# Patient Record
Sex: Female | Born: 1980 | Race: Black or African American | Hispanic: No | Marital: Married | Smoking: Never smoker
Health system: Southern US, Community
[De-identification: ages and names within clinical notes are randomized; demographics above are authoritative.]

---

## 2014-11-20 ENCOUNTER — Ambulatory Visit: Payer: Self-pay | Admitting: Family Medicine

## 2014-11-20 LAB — URINALYSIS, COMPLETE
BACTERIA: NEGATIVE
BILIRUBIN, UR: NEGATIVE
Blood: NEGATIVE
Glucose,UR: NEGATIVE
Ketone: NEGATIVE
LEUKOCYTE ESTERASE: NEGATIVE
Nitrite: NEGATIVE
PH: 5.5 (ref 5.0–8.0)
Protein: NEGATIVE
Specific Gravity: 1.025 (ref 1.000–1.030)
WBC UR: NONE SEEN /HPF (ref 0–5)

## 2014-11-20 LAB — PREGNANCY, URINE: PREGNANCY TEST, URINE: NEGATIVE m[IU]/mL

## 2014-11-20 LAB — WET PREP, GENITAL

## 2014-11-21 LAB — GC/CHLAMYDIA PROBE AMP

## 2014-11-26 ENCOUNTER — Ambulatory Visit: Payer: Self-pay | Admitting: Family Medicine

## 2015-11-02 ENCOUNTER — Emergency Department: Payer: Self-pay

## 2015-11-02 ENCOUNTER — Emergency Department
Admission: EM | Admit: 2015-11-02 | Discharge: 2015-11-02 | Disposition: A | Discharge status: Home / Self Care | Payer: Self-pay | Attending: Emergency Medicine | Admitting: Emergency Medicine

## 2015-11-02 ENCOUNTER — Encounter: Payer: Self-pay | Admitting: Emergency Medicine

## 2015-11-02 DIAGNOSIS — R51 Headache: Secondary | ICD-10-CM | POA: Insufficient documentation

## 2015-11-02 DIAGNOSIS — M549 Dorsalgia, unspecified: Secondary | ICD-10-CM | POA: Insufficient documentation

## 2015-11-02 DIAGNOSIS — R0600 Dyspnea, unspecified: Secondary | ICD-10-CM | POA: Insufficient documentation

## 2015-11-02 DIAGNOSIS — R42 Dizziness and giddiness: Secondary | ICD-10-CM | POA: Insufficient documentation

## 2015-11-02 DIAGNOSIS — R0602 Shortness of breath: Secondary | ICD-10-CM | POA: Insufficient documentation

## 2015-11-02 DIAGNOSIS — R110 Nausea: Secondary | ICD-10-CM | POA: Insufficient documentation

## 2015-11-02 DIAGNOSIS — Z3202 Encounter for pregnancy test, result negative: Secondary | ICD-10-CM | POA: Insufficient documentation

## 2015-11-02 DIAGNOSIS — R1013 Epigastric pain: Secondary | ICD-10-CM | POA: Insufficient documentation

## 2015-11-02 DIAGNOSIS — R079 Chest pain, unspecified: Secondary | ICD-10-CM | POA: Insufficient documentation

## 2015-11-02 DIAGNOSIS — R1011 Right upper quadrant pain: Secondary | ICD-10-CM | POA: Insufficient documentation

## 2015-11-02 LAB — URINALYSIS COMPLETE WITH MICROSCOPIC (ARMC ONLY)
BACTERIA UA: NONE SEEN
BILIRUBIN URINE: NEGATIVE
GLUCOSE, UA: NEGATIVE mg/dL
HGB URINE DIPSTICK: NEGATIVE
Leukocytes, UA: NEGATIVE
NITRITE: NEGATIVE
Protein, ur: NEGATIVE mg/dL
Specific Gravity, Urine: 1.013 (ref 1.005–1.030)
pH: 7 (ref 5.0–8.0)

## 2015-11-02 LAB — POCT PREGNANCY, URINE: Preg Test, Ur: NEGATIVE

## 2015-11-02 LAB — COMPREHENSIVE METABOLIC PANEL
ALBUMIN: 3.9 g/dL (ref 3.5–5.0)
ALK PHOS: 82 U/L (ref 38–126)
ALT: 36 U/L (ref 14–54)
AST: 54 U/L — AB (ref 15–41)
Anion gap: 6 (ref 5–15)
BILIRUBIN TOTAL: 1.2 mg/dL (ref 0.3–1.2)
BUN: 12 mg/dL (ref 6–20)
CALCIUM: 8.6 mg/dL — AB (ref 8.9–10.3)
CO2: 25 mmol/L (ref 22–32)
Chloride: 108 mmol/L (ref 101–111)
Creatinine, Ser: 0.74 mg/dL (ref 0.44–1.00)
GFR calc Af Amer: >60 mL/min (ref >60)
GFR calc non Af Amer: >60 mL/min (ref >60)
GLUCOSE: 112 mg/dL — AB (ref 65–99)
Potassium: 3 mmol/L — ABNORMAL LOW (ref 3.5–5.1)
SODIUM: 139 mmol/L (ref 135–145)
TOTAL PROTEIN: 7.2 g/dL (ref 6.5–8.1)

## 2015-11-02 LAB — CBC
HCT: 38.6 % (ref 35.0–47.0)
Hemoglobin: 12.9 g/dL (ref 12.0–16.0)
MCH: 29.9 pg (ref 26.0–34.0)
MCHC: 33.4 g/dL (ref 32.0–36.0)
MCV: 89.6 fL (ref 80.0–100.0)
PLATELETS: 335 10*3/uL (ref 150–440)
RBC: 4.31 MIL/uL (ref 3.80–5.20)
RDW: 13.1 % (ref 11.5–14.5)
WBC: 9.3 10*3/uL (ref 3.6–11.0)

## 2015-11-02 LAB — TROPONIN I: Troponin I: <0.03 ng/mL (ref <0.031)

## 2015-11-02 LAB — LIPASE, BLOOD: Lipase: 42 U/L (ref 11–51)

## 2015-11-02 MED ORDER — ONDANSETRON 4 MG PO TBDP: 4.0000 mg | ORAL_TABLET | Freq: Four times a day (QID) | ORAL | Status: DC | PRN

## 2015-11-02 MED ORDER — MORPHINE SULFATE (PF) 4 MG/ML IV SOLN
4.0000 mg | Freq: Once | INTRAVENOUS | Status: AC
  Administered 2015-11-02: 4 mg via INTRAVENOUS
  Filled 2015-11-02: qty 1

## 2015-11-02 MED ORDER — SODIUM CHLORIDE 0.9 % IV BOLUS (SEPSIS)
1000.0000 mL | Freq: Once | INTRAVENOUS | Status: AC
  Administered 2015-11-02: 1000 mL via INTRAVENOUS

## 2015-11-02 MED ORDER — IOHEXOL 300 MG/ML  SOLN
100.0000 mL | Freq: Once | INTRAMUSCULAR | Status: AC | PRN
  Administered 2015-11-02: 100 mL via INTRAVENOUS

## 2015-11-02 MED ORDER — POTASSIUM CHLORIDE CRYS ER 20 MEQ PO TBCR
40.0000 meq | EXTENDED_RELEASE_TABLET | Freq: Once | ORAL | Status: AC
  Administered 2015-11-02: 40 meq via ORAL
  Filled 2015-11-02: qty 2

## 2015-11-02 MED ORDER — PROMETHAZINE HCL 25 MG PO TABS: 25.0000 mg | ORAL_TABLET | Freq: Three times a day (TID) | ORAL | Status: AC | PRN

## 2015-11-02 MED ORDER — ONDANSETRON HCL 4 MG/2ML IJ SOLN
4.0000 mg | Freq: Once | INTRAMUSCULAR | Status: AC
Start: 2015-11-02 — End: 2015-11-02
  Administered 2015-11-02: 4 mg via INTRAVENOUS
  Filled 2015-11-02: qty 2

## 2015-11-02 MED ORDER — ONDANSETRON HCL 4 MG/2ML IJ SOLN
4.0000 mg | Freq: Once | INTRAMUSCULAR | Status: AC
  Administered 2015-11-02: 4 mg via INTRAVENOUS
  Filled 2015-11-02: qty 2

## 2015-11-02 MED ORDER — IOHEXOL 240 MG/ML SOLN
25.0000 mL | Freq: Once | INTRAMUSCULAR | Status: AC | PRN
  Administered 2015-11-02: 25 mL via ORAL

## 2015-11-02 NOTE — ED Notes (Signed)
Pt went to CT

## 2015-11-02 NOTE — ED Notes (Signed)
Patient transported to X-ray via stretcher 

## 2015-11-02 NOTE — ED Notes (Signed)
Patient with sudden onset of chest and upper back pain with shortness of breath that started about 20 minutes before arrival. Patient states that the chest and back pain is better but now has severe abd pain.

## 2015-11-02 NOTE — ED Notes (Signed)
Pt returned from CT °

## 2015-11-02 NOTE — ED Notes (Addendum)
Pt presents to ED c/o of abdominal pain following sudden onset of chest pain, back pain, SOB. Pt states that SOB and chest pain have subsided, states back pain is located middle of back. Pt states hx of abdominal pain with no explanation. Pt states she takes excedrin migraine daily and states "I don't know if that has anything to do with it or not.". Pt is laying on stretcher rigid and holding abdomen with right hand.

## 2015-11-02 NOTE — ED Notes (Signed)
Unsuccessful attempt to collect urine specimen. Informed pt to notify staff when pt is able to void again. Pt verbalized understanding.

## 2015-11-02 NOTE — Discharge Instructions (Signed)
Abdominal Pain, Adult Many things can cause abdominal pain. Usually, abdominal pain is not caused by a disease and will improve without treatment. It can often be observed and treated at home. Your health care provider will do a physical exam and possibly order blood tests and X-rays to help determine the seriousness of your pain. However, in many cases, more time must pass before a clear cause of the pain can be found. Before that point, your health care provider may not know if you need more testing or further treatment. HOME CARE INSTRUCTIONS Monitor your abdominal pain for any changes. The following actions may help to alleviate any discomfort you are experiencing:  Only take over-the-counter or prescription medicines as directed by your health care provider.  Do not take laxatives unless directed to do so by your health care provider.  Try a clear liquid diet (broth, tea, or water) as directed by your health care provider. Slowly move to a bland diet as tolerated. SEEK MEDICAL CARE IF:  You have unexplained abdominal pain.  You have abdominal pain associated with nausea or diarrhea.  You have pain when you urinate or have a bowel movement.  You experience abdominal pain that wakes you in the night.  You have abdominal pain that is worsened or improved by eating food.  You have abdominal pain that is worsened with eating fatty foods.  You have a fever. SEEK IMMEDIATE MEDICAL CARE IF:  Your pain does not go away within 2 hours.  You keep throwing up (vomiting).  Your pain is felt only in portions of the abdomen, such as the right side or the left lower portion of the abdomen.  You pass bloody or black tarry stools. MAKE SURE YOU:  Understand these instructions.  Will watch your condition.  Will get help right away if you are not doing well or get worse.   This information is not intended to replace advice given to you by your health care provider. Make sure you discuss  any questions you have with your health care provider.   Document Released: 09/08/2005 Document Revised: 08/20/2015 Document Reviewed: 08/08/2013 Elsevier Interactive Patient Education 2016 [employer] Inc[employer]  Nonspecific Chest Pain  Chest pain can be caused by many different conditions. There is always a chance that your pain could be related to something serious, such as a heart attack or a blood clot in your lungs. Chest pain can also be caused by conditions that are not life-threatening. If you have chest pain, it is very important to follow up with your health care provider. CAUSES  Chest pain can be caused by:  Heartburn.  Pneumonia or bronchitis.  Anxiety or stress.  Inflammation around your heart (pericarditis) or lung (pleuritis or pleurisy).  A blood clot in your lung.  A collapsed lung (pneumothorax). It can develop suddenly on its own (spontaneous pneumothorax) or from trauma to the chest.  Shingles infection (varicella-zoster virus).  Heart attack.  Damage to the bones, muscles, and cartilage that make up your chest wall. This can include:  Bruised bones due to injury.  Strained muscles or cartilage due to frequent or repeated coughing or overwork.  Fracture to one or more ribs.  Sore cartilage due to inflammation (costochondritis). RISK FACTORS  Risk factors for chest pain may include:  Activities that increase your risk for trauma or injury to your chest.  Respiratory infections or conditions that cause frequent coughing.  Medical conditions or overeating that can cause heartburn.  Heart disease or family  history of heart disease.  Conditions or health behaviors that increase your risk of developing a blood clot.  Having had chicken pox (varicella zoster). SIGNS AND SYMPTOMS Chest pain can feel like:  Burning or tingling on the surface of your chest or deep in your chest.  Crushing, pressure, aching, or squeezing pain.  Dull or sharp pain that is  worse when you move, cough, or take a deep breath.  Pain that is also felt in your back, neck, shoulder, or arm, or pain that spreads to any of these areas. Your chest pain may come and go, or it may stay constant. DIAGNOSIS Lab tests or other studies may be needed to find the cause of your pain. Your health care provider may have you take a test called an ambulatory ECG (electrocardiogram). An ECG records your heartbeat patterns at the time the test is performed. You may also have other tests, such as:  Transthoracic echocardiogram (TTE). During echocardiography, sound waves are used to create a picture of all of the heart structures and to look at how blood flows through your heart.  Transesophageal echocardiogram (TEE).This is a more advanced imaging test that obtains images from inside your body. It allows your health care provider to see your heart in finer detail.  Cardiac monitoring. This allows your health care provider to monitor your heart rate and rhythm in real time.  Holter monitor. This is a portable device that records your heartbeat and can help to diagnose abnormal heartbeats. It allows your health care provider to track your heart activity for several days, if needed.  Stress tests. These can be done through exercise or by taking medicine that makes your heart beat more quickly.  Blood tests.  Imaging tests. TREATMENT  Your treatment depends on what is causing your chest pain. Treatment may include:  Medicines. These may include:  Acid blockers for heartburn.  Anti-inflammatory medicine.  Pain medicine for inflammatory conditions.  Antibiotic medicine, if an infection is present.  Medicines to dissolve blood clots.  Medicines to treat coronary artery disease.  Supportive care for conditions that do not require medicines. This may include:  Resting.  Applying heat or cold packs to injured areas.  Limiting activities until pain decreases. HOME CARE  INSTRUCTIONS  If you were prescribed an antibiotic medicine, finish it all even if you start to feel better.  Avoid any activities that bring on chest pain.  Do not use any tobacco products, including cigarettes, chewing tobacco, or electronic cigarettes. If you need help quitting, ask your health care provider.  Do not drink alcohol.  Take medicines only as directed by your health care provider.  Keep all follow-up visits as directed by your health care provider. This is important. This includes any further testing if your chest pain does not go away.  If heartburn is the cause for your chest pain, you may be told to keep your head raised (elevated) while sleeping. This reduces the chance that acid will go from your stomach into your esophagus.  Make lifestyle changes as directed by your health care provider. These may include:  Getting regular exercise. Ask your health care provider to suggest some activities that are safe for you.  Eating a heart-healthy diet. A registered dietitian can help you to learn healthy eating options.  Maintaining a healthy weight.  Managing diabetes, if necessary.  Reducing stress. SEEK MEDICAL CARE IF:  Your chest pain does not go away after treatment.  You have a rash with  blisters on your chest.  You have a fever. SEEK IMMEDIATE MEDICAL CARE IF:   Your chest pain is worse.  You have an increasing cough, or you cough up blood.  You have severe abdominal pain.  You have severe weakness.  You faint.  You have chills.  You have sudden, unexplained chest discomfort.  You have sudden, unexplained discomfort in your arms, back, neck, or jaw.  You have shortness of breath at any time.  You suddenly start to sweat, or your skin gets clammy.  You feel nauseous or you vomit.  You suddenly feel light-headed or dizzy.  Your heart begins to beat quickly, or it feels like it is skipping beats. These symptoms may represent a serious  problem that is an emergency. Do not wait to see if the symptoms will go away. Get medical help right away. Call your local emergency services (911 in the [place] Do not drive yourself to the hospital.   This information is not intended to replace advice given to you by your health care provider. Make sure you discuss any questions you have with your health care provider.   Document Released: 09/08/2005 Document Revised: 12/20/2014 Document Reviewed: 07/05/2014 Elsevier Interactive Patient Education 2016 [employer]  Shortness of Breath Shortness of breath means you have trouble breathing. It could also mean that you have a medical problem. You should get immediate medical care for shortness of breath. CAUSES   Not enough oxygen in the air such as with high altitudes or a smoke-filled room.  Certain lung diseases, infections, or problems.  Heart disease or conditions, such as angina or heart failure.  Low red blood cells (anemia).  Poor physical fitness, which can cause shortness of breath when you exercise.  Chest or back injuries or stiffness.  Being overweight.  Smoking.  Anxiety, which can make you feel like you are not getting enough air. DIAGNOSIS  Serious medical problems can often be found during your physical exam. Tests may also be done to determine why you are having shortness of breath. Tests may include:  Chest X-rays.  Lung function tests.  Blood tests.  An electrocardiogram (ECG).  An ambulatory electrocardiogram. An ambulatory ECG records your heartbeat patterns over a 24-hour period.  Exercise testing.  A transthoracic echocardiogram (TTE). During echocardiography, sound waves are used to evaluate how blood flows through your heart.  A transesophageal echocardiogram (TEE).  Imaging scans. Your health care provider may not be able to find a cause for your shortness of breath after your exam. In this case, it is important to have a follow-up exam with  your health care provider as directed.  TREATMENT  Treatment for shortness of breath depends on the cause of your symptoms and can vary greatly. HOME CARE INSTRUCTIONS   Do not smoke. Smoking is a common cause of shortness of breath. If you smoke, ask for help to quit.  Avoid being around chemicals or things that may bother your breathing, such as paint fumes and dust.  Rest as needed. Slowly resume your usual activities.  If medicines were prescribed, take them as directed for the full length of time directed. This includes oxygen and any inhaled medicines.  Keep all follow-up appointments as directed by your health care provider. SEEK MEDICAL CARE IF:   Your condition does not improve in the time expected.  You have a hard time doing your normal activities even with rest.  You have any new symptoms. SEEK IMMEDIATE MEDICAL CARE IF:  Your shortness of breath gets worse.  You feel light-headed, faint, or develop a cough not controlled with medicines.  You start coughing up blood.  You have pain with breathing.  You have chest pain or pain in your arms, shoulders, or abdomen.  You have a fever.  You are unable to walk up stairs or exercise the way you normally do. MAKE SURE YOU:  Understand these instructions.  Will watch your condition.  Will get help right away if you are not doing well or get worse.   This information is not intended to replace advice given to you by your health care provider. Make sure you discuss any questions you have with your health care provider.   Document Released: 08/24/2001 Document Revised: 12/04/2013 Document Reviewed: 02/14/2012 Elsevier Interactive Patient Education [employer]

## 2015-11-02 NOTE — ED Provider Notes (Signed)
-----------------------------------------   9:13 AM on 11/02/2015 -----------------------------------------  Patient troponin negative, on reexamination patient reports ongoing nausea and appears uncomfortable and in pain. She reports that the symptoms happen off and on, and she is been seen previously for them without any clear cause. I reviewed her labs and CT, all of which are quite reassuring. She denies any chest pain to me or trouble breathing at this time.  The patient is comfortable going home with prescriptions for nausea medication, and not desire any pain medications for home.  Because she still uncomfortable this time, we will give additional pain medication, antiemetic, and will monitor her before discharging her to home. I did discuss the importance of follow-up and recommended she go see gastroenterology for further evaluation, and she is quite agreeable with this. Careful abdominal pain return precautions discussed. Patient understands not to drive herself today.  ----------------------------------------- 10:25 AM on 11/02/2015 -----------------------------------------  Patient reports improvement. Nausea and pain improved, taking by mouth without any further emesis. Discharge home with return precautions and follow-up care advised. Patient's husband driving her home.  Sharyn CreamerMark Quale, MD 11/02/15 1026

## 2015-11-02 NOTE — ED Provider Notes (Signed)
[facility] Emergency Department Provider Note  ____________________________________________  Time seen: Approximately 2:22 AM  I have reviewed the triage vital signs and the nursing notes.   HISTORY  Chief Complaint Chest Pain; Shortness of Breath; and Abdominal Pain    HPI Danielle Huffman is a 34 y.o. female who comes in today with abdominal pain chest pain and shortness of breath. The patient reports that 20 minutes prior to her arrival she suddenly developed this pain in her chest as well as the right side of her back and in her epigastric abdomen. The patient also reports that she was feeling short of breath. She reports that on the way here the shortness of breath and the chest pain subsided but the abdominal pain became worse. She reports that the pain radiates to her right flank. The patient reports that she had this pain in the past and they did not find anything wrong with her. She reports that [facility] the same thing. The patient did not take anything for pain when she arrived. She has had some nausea with no vomiting and rates her pain a 10 out of 10 in intensity. The patient has some occasional dizziness and daily headaches.The patient reports that the pain came on hard and fast.    Past Medical History Migraines  There are no active problems to display for this patient.   Past Surgical History  Adenoidectomy  Current Outpatient Rx  Name  Route  Sig  Dispense  Refill  . aspirin-acetaminophen-caffeine (EXCEDRIN MIGRAINE) 250-250-65 MG tablet   Oral   Take 1 tablet by mouth every 6 (six) hours as needed for headache.           Allergies Review of patient's allergies indicates no known allergies.  No family history on file.  Social History Social History  Substance Use Topics  . Smoking status: Never Smoker   . Smokeless tobacco: None  . Alcohol Use: Yes     Comment: occasionaly    Review of  Systems Constitutional: No fever/chills Eyes: No visual changes. ENT: No sore throat. Cardiovascular:  chest pain. Respiratory: shortness of breath. Gastrointestinal: abdominal pain  nausea, no vomiting.  No diarrhea.  No constipation. Genitourinary: Negative for dysuria. Musculoskeletal:  back pain. Skin: Negative for rash. Neurological: Negative for headaches, focal weakness or numbness.  10-point ROS otherwise negative.  ____________________________________________   PHYSICAL EXAM:  VITAL SIGNS: ED Triage Vitals  Enc Vitals Group     BP 11/02/15 0152 129/93 mmHg     Pulse Rate 11/02/15 0152 78     Resp 11/02/15 0152 24     Temp --      Temp src --      SpO2 11/02/15 0152 98 %     Weight 11/02/15 0145 180 lb (81.647 kg)     Height 11/02/15 0145 5\' 5"  (1.651 m)     Head Cir --      Peak Flow --      Pain Score 11/02/15 0145 10     Pain Loc --      Pain Edu? --      Excl. in GC? --     Constitutional: Alert and oriented. Well appearing and in moderate distress. Eyes: Conjunctivae are normal. PERRL. EOMI. Head: Atraumatic. Nose: No congestion/rhinnorhea. Mouth/Throat: Mucous membranes are moist.  Oropharynx non-erythematous. Cardiovascular: Normal rate, regular rhythm. Grossly normal heart sounds.  Good peripheral circulation. Respiratory: Normal respiratory effort.  No retractions. Lungs CTAB. Gastrointestinal: Soft with epigastric and RUQ pain No distention. Positive bowel sounds Musculoskeletal: No lower extremity tenderness nor edema.   Neurologic:  Normal speech and language.  Skin:  Skin is warm, dry and intact.  Psychiatric: Mood and affect are normal.   ____________________________________________   LABS (all labs ordered are listed, but only abnormal results are displayed)  Labs Reviewed  URINALYSIS COMPLETEWITH MICROSCOPIC ([facility] ONLY) - Abnormal; Notable for the following:    Color, Urine YELLOW (*)    APPearance CLEAR (*)    Ketones, ur 1+ (*)     Squamous Epithelial / LPF 0-5 (*)    All other components within normal limits  COMPREHENSIVE METABOLIC PANEL - Abnormal; Notable for the following:    Potassium 3.0 (*)    Glucose, Bld 112 (*)    Calcium 8.6 (*)    AST 54 (*)    All other components within normal limits  CBC  TROPONIN I  LIPASE, BLOOD  TROPONIN I  POC URINE PREG, ED  POCT PREGNANCY, URINE   ____________________________________________  EKG  ED ECG REPORT I, Rebecka Apley, the attending physician, personally viewed and interpreted this ECG.   Date: 11/02/2015  EKG Time: 148  Rate: 68  Rhythm: normal sinus rhythm  Axis: normal  Intervals:none  ST&T Change: none  ____________________________________________  RADIOLOGY  CXR: Negative, no active cardiopulmonary disease CT abdomen and pelvis: No acute abnormality in the abdomen/pelvis ____________________________________________   PROCEDURES  Procedure(s) performed: None  Critical Care performed: No  ____________________________________________   INITIAL IMPRESSION / ASSESSMENT AND PLAN / ED COURSE  Pertinent labs & imaging results that were available during my care of the patient were reviewed by me and considered in my medical decision making (see chart for details).  This is a 34 year old female who comes in with some shortness of breath chest pain and abdominal pain. I will check some blood work to include a troponin, lipase, urinalysis. I will give the patient 4 morphine, 4 of Zofran and a liter of normal saline. I will reevaluate the patient once I received the results of her blood work, her x-ray and her urine. At that time I'll determine if the patient needs any further imaging or any further testing.  The patient's chest x-ray and CT are unremarkable at this time. The patient's blood work is also unremarkable. I will reassess another troponin and if that is unremarkable the patient be discharged home to follow-up with her primary  care physician. Dr. Fanny Bien will follow up the troponin.  ____________________________________________   FINAL CLINICAL IMPRESSION(S) / ED DIAGNOSES  Final diagnoses:  Chest pain, unspecified chest pain type  Dyspnea  Epigastric pain      Rebecka Apley, MD 11/02/15 [phone]

## 2017-10-31 ENCOUNTER — Emergency Department
Admission: EM | Admit: 2017-10-31 | Discharge: 2017-10-31 | Disposition: A | Discharge status: Home / Self Care | Payer: Self-pay | Attending: Emergency Medicine | Admitting: Emergency Medicine

## 2017-10-31 ENCOUNTER — Encounter: Payer: Self-pay | Admitting: *Deleted

## 2017-10-31 DIAGNOSIS — Z23 Encounter for immunization: Secondary | ICD-10-CM | POA: Insufficient documentation

## 2017-10-31 DIAGNOSIS — S61012A Laceration without foreign body of left thumb without damage to nail, initial encounter: Secondary | ICD-10-CM | POA: Insufficient documentation

## 2017-10-31 DIAGNOSIS — Y939 Activity, unspecified: Secondary | ICD-10-CM | POA: Insufficient documentation

## 2017-10-31 DIAGNOSIS — Y999 Unspecified external cause status: Secondary | ICD-10-CM | POA: Insufficient documentation

## 2017-10-31 DIAGNOSIS — Y92009 Unspecified place in unspecified non-institutional (private) residence as the place of occurrence of the external cause: Secondary | ICD-10-CM | POA: Insufficient documentation

## 2017-10-31 DIAGNOSIS — W260XXA Contact with knife, initial encounter: Secondary | ICD-10-CM | POA: Insufficient documentation

## 2017-10-31 MED ORDER — TETANUS-DIPHTH-ACELL PERTUSSIS 5-2.5-18.5 LF-MCG/0.5 IM SUSP
0.5000 mL | Freq: Once | INTRAMUSCULAR | Status: AC
  Administered 2017-10-31: 0.5 mL via INTRAMUSCULAR
  Filled 2017-10-31: qty 0.5

## 2017-10-31 MED ORDER — LIDOCAINE HCL (PF) 1 % IJ SOLN
5.0000 mL | Freq: Once | INTRAMUSCULAR | Status: AC
  Administered 2017-10-31: 5 mL
  Filled 2017-10-31: qty 5

## 2017-10-31 NOTE — Discharge Instructions (Signed)
Keep the wound

## 2017-10-31 NOTE — ED Triage Notes (Signed)
States left thumb laceration from chopping veggies, unsure of last tetanus status

## 2017-10-31 NOTE — ED Notes (Signed)
Pt ambulatory upon discharge. Verbalized understanding of follow-up care, suture removal and pain management. A&O x4. Skin warm and dry.

## 2017-10-31 NOTE — ED Notes (Signed)
Pt reports cutting her left thumb while cooking today. Pt with noted laceration to the tip of left thumb, bleeding controlled with pressure and bandage.

## 2017-10-31 NOTE — ED Provider Notes (Signed)
[facility] Emergency Department Provider Note ____________________________________________  Time seen: 1902  I have reviewed the triage vital signs and the nursing notes.  HISTORY  Chief Complaint  Laceration  HPI Danielle Huffman is a 36 y.o. female presents to the ED, by her husband for evaluation of a thumb laceration from home.  Patient was reportedly chopping veggies, when she accidentally cut her left thumb with a knife.  She is unclear for tetanus status at this time.  She presents now with a constantly oozing wound.  History reviewed. No pertinent past medical history.  There are no active problems to display for this patient.  History reviewed. No pertinent surgical history.  Prior to Admission medications   Medication Sig Start Date End Date Taking? Authorizing Provider  aspirin-acetaminophen-caffeine (EXCEDRIN MIGRAINE) [id number] MG tablet Take 1 tablet by mouth every 6 (six) hours as needed for headache.    [provider]  ondansetron (ZOFRAN ODT) 4 MG disintegrating tablet Take 1 tablet (4 mg total) by mouth every 6 (six) hours as needed for nausea or vomiting. 11/02/15   Sharyn CreamerQuale, Mark, MD  promethazine (PHENERGAN) 25 MG tablet Take 1 tablet (25 mg total) by mouth every 8 (eight) hours as needed for nausea or vomiting. 11/02/15   Sharyn CreamerQuale, Mark, MD   Allergies Patient has no known allergies.  History reviewed. No pertinent family history.  Social History Social History   Tobacco Use  . Smoking status: Never Smoker  Substance Use Topics  . Alcohol use: Yes    Comment: occasionaly  . Drug use: No    Review of Systems  Constitutional: Negative for fever. Cardiovascular: Negative for chest pain. Respiratory: Negative for shortness of breath. Musculoskeletal: Negative for back pain. Skin: Negative for rash. Left thumb lac as above.  Neurological: Negative for headaches, focal weakness or  numbness. ____________________________________________  PHYSICAL EXAM:  VITAL SIGNS: ED Triage Vitals  Enc Vitals Group     BP 10/31/17 2017 (!) 140/98     Pulse Rate 10/31/17 2017 75     Resp 10/31/17 2017 16     Temp --      Temp src --      SpO2 10/31/17 2017 96 %     Weight 10/31/17 1825 190 lb (86.2 kg)     Height 10/31/17 1825 5\' 4"  (1.626 m)     Head Circumference --      Peak Flow --      Pain Score 10/31/17 1824 9     Pain Loc --      Pain Edu? --      Excl. in [place]? --     Constitutional: Alert and oriented. Well appearing and in no distress. Head: Normocephalic and atraumatic. Cardiovascular:  Normal distal pulses. Respiratory: Normal respiratory effort.  Musculoskeletal: Normal composite fist.  Left arm with a flap laceration noted to the distal tip.  Active bleeding is appreciated.  No nailbed injury is noted.  Nontender with normal range of motion in all extremities.  Neurologic:  Normal gross sensation. Normal speech and language. No gross focal neurologic deficits are appreciated. Skin:  Skin is warm, dry and intact. No rash noted. ____________________________________________  PROCEDURES  .Marland Kitchen.Laceration Repair Date/Time: 10/31/2017 11:55 PM Performed by: Lissa HoardMenshew, Yarexi Pawlicki V Bacon, PA-C Authorized by: Lissa HoardMenshew, Fusako Tanabe V Bacon, PA-C   Consent:    Consent obtained:  Verbal   Consent given by:  Patient   Risks discussed:  Poor wound healing Anesthesia (see MAR for exact dosages):  Anesthesia method:  Nerve block   Block needle gauge:  27 G   Block anesthetic:  Lidocaine 1% w/o epi   Block technique:  Transthecal block   Block injection procedure:  Anatomic landmarks identified and introduced needle   Block outcome:  Anesthesia achieved Laceration details:    Location:  Finger   Finger location:  L thumb   Length (cm):  3 Repair type:    Repair type:  Simple Pre-procedure details:    Preparation:  Patient was prepped and draped in usual sterile  fashion Exploration:    Hemostasis achieved with:  Tourniquet   Contaminated: no   Treatment:    Area cleansed with:  Betadine   Amount of cleaning:  Standard   Irrigation solution:  Sterile saline   Irrigation method:  Syringe Skin repair:    Repair method:  Sutures   Suture size:  4-0   Suture material:  Nylon   Suture technique:  Simple interrupted   Number of sutures:  5 Approximation:    Approximation:  Close   Vermilion border: well-aligned   Post-procedure details:    Dressing:  Non-adherent dressing   Patient tolerance of procedure:  Tolerated well, no immediate complications  ____________________________________________  INITIAL IMPRESSION / ASSESSMENT AND PLAN / ED COURSE  Patient presents to the ED for evaluation management of a left thumb laceration.  The wound is appropriately prepped and draped.  Suture repair is performed in good wound approximation is achieved.  Wound care instructions are provided.  She will follow-up with a local urgent care for suture removal in 10-12 days.  ____________________________________________  FINAL CLINICAL IMPRESSION(S) / ED DIAGNOSES  Final diagnoses:  Laceration of left thumb without foreign body without damage to nail, initial encounter      Lissa HoardMenshew, Javious Hallisey V Bacon, PA-C 11/01/17 0002    Sharman CheekStafford, Phillip, MD 11/01/17 [id number]

## 2019-10-10 ENCOUNTER — Other Ambulatory Visit: Payer: Self-pay

## 2019-10-10 ENCOUNTER — Emergency Department: Payer: Self-pay

## 2019-10-10 ENCOUNTER — Emergency Department
Admission: EM | Admit: 2019-10-10 | Discharge: 2019-10-10 | Disposition: A | Discharge status: Home / Self Care | Payer: Self-pay | Attending: Emergency Medicine | Admitting: Emergency Medicine

## 2019-10-10 DIAGNOSIS — R110 Nausea: Secondary | ICD-10-CM | POA: Insufficient documentation

## 2019-10-10 DIAGNOSIS — R519 Headache, unspecified: Secondary | ICD-10-CM | POA: Insufficient documentation

## 2019-10-10 DIAGNOSIS — R2243 Localized swelling, mass and lump, lower limb, bilateral: Secondary | ICD-10-CM | POA: Insufficient documentation

## 2019-10-10 DIAGNOSIS — R079 Chest pain, unspecified: Secondary | ICD-10-CM | POA: Insufficient documentation

## 2019-10-10 DIAGNOSIS — R432 Parageusia: Secondary | ICD-10-CM | POA: Insufficient documentation

## 2019-10-10 LAB — TROPONIN I (HIGH SENSITIVITY): Troponin I (High Sensitivity): 3 ng/L (ref <18)

## 2019-10-10 LAB — BASIC METABOLIC PANEL
Anion gap: 8 (ref 5–15)
BUN: 24 mg/dL — ABNORMAL HIGH (ref 6–20)
CO2: 22 mmol/L (ref 22–32)
Calcium: 9 mg/dL (ref 8.9–10.3)
Chloride: 106 mmol/L (ref 98–111)
Creatinine, Ser: 0.76 mg/dL (ref 0.44–1.00)
GFR calc Af Amer: >60 mL/min (ref >60)
GFR calc non Af Amer: >60 mL/min (ref >60)
Glucose, Bld: 101 mg/dL — ABNORMAL HIGH (ref 70–99)
Potassium: 3.5 mmol/L (ref 3.5–5.1)
Sodium: 136 mmol/L (ref 135–145)

## 2019-10-10 LAB — CBC
HCT: 38.6 % (ref 36.0–46.0)
Hemoglobin: 12.8 g/dL (ref 12.0–15.0)
MCH: 29.1 pg (ref 26.0–34.0)
MCHC: 33.2 g/dL (ref 30.0–36.0)
MCV: 87.7 fL (ref 80.0–100.0)
Platelets: 393 10*3/uL (ref 150–400)
RBC: 4.4 MIL/uL (ref 3.87–5.11)
RDW: 13.1 % (ref 11.5–15.5)
WBC: 9.5 10*3/uL (ref 4.0–10.5)
nRBC: 0 % (ref 0.0–0.2)

## 2019-10-10 MED ORDER — SUCRALFATE 1 G PO TABS: 1.0000 g | ORAL_TABLET | Freq: Four times a day (QID) | ORAL | 0 refills | Status: AC

## 2019-10-10 MED ORDER — BUTALBITAL-APAP-CAFFEINE 50-325-40 MG PO TABS: 1.0000 | ORAL_TABLET | Freq: Four times a day (QID) | ORAL | 0 refills | Status: DC | PRN

## 2019-10-10 MED ORDER — FAMOTIDINE 20 MG PO TABS: 20.0000 mg | ORAL_TABLET | Freq: Every day | ORAL | 1 refills | Status: AC

## 2019-10-10 NOTE — Discharge Instructions (Addendum)
Please seek medical attention for any high fevers, chest pain, shortness of breath, change in behavior, persistent vomiting, bloody stool or any other new or concerning symptoms.  

## 2019-10-10 NOTE — ED Triage Notes (Signed)
Reports intermittent mild chest pains over last week, sharp. Pt also reports bilateral ankle swelling that she noticed around the same time of chest pains. Metallic taste in mouth. Pt alert and oriented X4, cooperative, RR even and unlabored, color WNL. Pt in NAD.

## 2019-10-10 NOTE — ED Provider Notes (Signed)
[facility] Emergency Department Provider Note ____________________________________________   I have reviewed the triage vital signs and the nursing notes.   HISTORY  Chief Complaint Chest Pain and Leg Swelling   History limited by: Not Limited   HPI Danielle Huffman is a 38 y.o. female who presents to the emergency department today with multiple medical complaints.  Her complaints consist of bad headaches, intermittent chest pain, intermittent nausea, intermittent leg swelling and a metallic taste in her mouth..  She states that she has a history of migraines.  For the past 2 weeks or so they are not responding like they normally do to Excedrin.  She states that during this time she has had intermittent chest pain.  She describes it as sharp located in the center of her chest.  She is also noticed a metallic taste in mouth and has had intermittent nausea.  She also states that her ankles will become swollen.  She denies any activity that makes the pain better or worse.  She denies any fevers. Records reviewed. Per medical record review patient has a history of taking excedrin migraine  History reviewed. No pertinent past medical history.  There are no active problems to display for this patient.   History reviewed. No pertinent surgical history.  Prior to Admission medications   Medication Sig Start Date End Date Taking? Authorizing Provider  aspirin-acetaminophen-caffeine (EXCEDRIN MIGRAINE) [id number] MG tablet Take 1 tablet by mouth every 6 (six) hours as needed for headache.    [provider]  ondansetron (ZOFRAN ODT) 4 MG disintegrating tablet Take 1 tablet (4 mg total) by mouth every 6 (six) hours as needed for nausea or vomiting. 11/02/15   Delman Kitten, MD  promethazine (PHENERGAN) 25 MG tablet Take 1 tablet (25 mg total) by mouth every 8 (eight) hours as needed for nausea or vomiting. 11/02/15   Delman Kitten, MD    Allergies Patient has no  known allergies.  No family history on file.  Social History Social History   Tobacco Use  . Smoking status: Never Smoker  Substance Use Topics  . Alcohol use: Yes    Comment: occasionaly  . Drug use: No    Review of Systems Constitutional: No fever/chills Eyes: No visual changes. ENT: Positive for metallic taste in her mouth.  Cardiovascular: Positive for chest pain. Respiratory: Denies shortness of breath. Gastrointestinal: No abdominal pain.  Positive for nausea.  Genitourinary: Negative for dysuria. Musculoskeletal: Negative for back pain. Skin: Negative for rash. Neurological: Positive for headache.  ____________________________________________   PHYSICAL EXAM:  VITAL SIGNS: ED Triage Vitals [10/10/19 1759]  Enc Vitals Group     BP (!) 159/86     Pulse Rate 83     Resp 16     Temp 98.6 F (37 C)     Temp Source Oral     SpO2 100 %     Weight 200 lb (90.7 kg)     Height 5' 5"  (1.651 m)     Head Circumference      Peak Flow      Pain Score 8   Constitutional: Alert and oriented.  Eyes: Conjunctivae are normal.  ENT      Head: Normocephalic and atraumatic.      Nose: No congestion/rhinnorhea.      Mouth/Throat: Mucous membranes are moist.      Neck: No stridor. Hematological/Lymphatic/Immunilogical: No cervical lymphadenopathy. Cardiovascular: Normal rate, regular rhythm.  No murmurs, rubs, or gallops.  Respiratory: Normal respiratory  effort without tachypnea nor retractions. Breath sounds are clear and equal bilaterally. No wheezes/rales/rhonchi. Gastrointestinal: Soft and non tender. No rebound. No guarding.  Genitourinary: Deferred Musculoskeletal: Normal range of motion in all extremities. No lower extremity edema. Neurologic:  Normal speech and language. No gross focal neurologic deficits are appreciated.  Skin:  Skin is warm, dry and intact. No rash noted. Psychiatric: Mood and affect are normal. Speech and behavior are normal. Patient exhibits  appropriate insight and judgment.  ____________________________________________    LABS (pertinent positives/negatives)  Trop hs 3 CBC wbc 9.5, hgb 12.8, plt 393 BMP wnl except glu 101, bun 24  ____________________________________________   EKG  I, Nance Pear, attending physician, personally viewed and interpreted this EKG  EKG Time: 1749 Rate: 64 Rhythm: normal sinus rhythm Axis: normal Intervals: qtc 365 QRS: narrow ST changes: no st elevation Impression: normal ekg   ____________________________________________    RADIOLOGY  CXR No acute abnormality  ____________________________________________   PROCEDURES  Procedures  ____________________________________________   INITIAL IMPRESSION / ASSESSMENT AND PLAN / ED COURSE  Pertinent labs & imaging results that were available during my care of the patient were reviewed by me and considered in my medical decision making (see chart for details).   Patient presented to the emergency department today with myriad medical complaints.  Patient's blood work EKG and chest x-ray without concerning findings.  I do wonder if the nausea, metallic taste and intermittent chest pain are related and could be related to gastritis.  I discussed this with the patient.  We will get best with sucralfate.  Did discuss that she could try compression stockings for the intermittent ankle swelling.  At this time I doubt CHF, kidney failure or liver failure.  ____________________________________________   FINAL CLINICAL IMPRESSION(S) / ED DIAGNOSES  Final diagnoses:  Nonspecific chest pain  Bad headache     Note: This dictation was prepared with Dragon dictation. Any transcriptional errors that result from this process are unintentional     Nance Pear, MD 10/10/19 2313

## 2019-11-09 ENCOUNTER — Other Ambulatory Visit: Payer: Self-pay

## 2019-11-09 ENCOUNTER — Emergency Department
Admission: EM | Admit: 2019-11-09 | Discharge: 2019-11-09 | Disposition: A | Discharge status: Home / Self Care | Payer: Self-pay | Attending: Student in an Organized Health Care Education/Training Program | Admitting: Student in an Organized Health Care Education/Training Program

## 2019-11-09 DIAGNOSIS — M5441 Lumbago with sciatica, right side: Secondary | ICD-10-CM | POA: Insufficient documentation

## 2019-11-09 DIAGNOSIS — Z79899 Other long term (current) drug therapy: Secondary | ICD-10-CM | POA: Insufficient documentation

## 2019-11-09 MED ORDER — TRAMADOL HCL 50 MG PO TABS: 50.0000 mg | ORAL_TABLET | Freq: Four times a day (QID) | ORAL | 0 refills | Status: DC | PRN

## 2019-11-09 MED ORDER — PREDNISONE 10 MG PO TABS: ORAL_TABLET | ORAL | 0 refills | Status: DC

## 2019-11-09 NOTE — ED Triage Notes (Signed)
Pt c/o right lower back pain that radiates into the right leg since last week with a hx of the same.

## 2019-11-09 NOTE — ED Notes (Signed)
Pt here with c/o back pain, sitting in bed at this time.

## 2019-11-09 NOTE — ED Provider Notes (Signed)
[facility] Emergency Department Provider Note   ____________________________________________   First MD Initiated Contact with Patient 11/09/19 [phone]     (approximate)  I have reviewed the triage vital signs and the nursing notes.   HISTORY  Chief Complaint Back Pain   HPI Danielle Huffman is a 38 y.o. female presents to the ED with complaint of right lower back pain with radiation into her right leg for the last week.  Patient denies any new injury.  She states that she has had right leg sciatica in the past and at that time was treated for ibuprofen.  Patient currently has been taking Tylenol and ibuprofen without any relief at home.  She denies any saddle anesthesias, incontinence of bowel or bladder or urinary symptoms.  Patient continues to ambulate without any assistance.  Currently she rates her pain as a 10/10.     History reviewed. No pertinent past medical history.  There are no active problems to display for this patient.   History reviewed. No pertinent surgical history.  Prior to Admission medications   Medication Sig Start Date End Date Taking? Authorizing Provider  aspirin-acetaminophen-caffeine (EXCEDRIN MIGRAINE) [phone] MG tablet Take 1 tablet by mouth every 6 (six) hours as needed for headache.    [provider]  butalbital-acetaminophen-caffeine (FIORICET) 50-325-40 MG tablet Take 1 tablet by mouth every 6 (six) hours as needed for headache. 10/10/19 10/09/20  Phineas Semen, MD  famotidine (PEPCID) 20 MG tablet Take 1 tablet (20 mg total) by mouth daily. 10/10/19 10/09/20  Phineas Semen, MD  ondansetron (ZOFRAN ODT) 4 MG disintegrating tablet Take 1 tablet (4 mg total) by mouth every 6 (six) hours as needed for nausea or vomiting. 11/02/15   Sharyn Creamer, MD  predniSONE (DELTASONE) 10 MG tablet Take 6 tablets  today, on day 2 take 5 tablets, day 3 take 4 tablets, day 4 take 3 tablets, day 5 take  2 tablets and 1  tablet the last day 11/09/19   Tommi Rumps, PA-C  promethazine (PHENERGAN) 25 MG tablet Take 1 tablet (25 mg total) by mouth every 8 (eight) hours as needed for nausea or vomiting. 11/02/15   Sharyn Creamer, MD  sucralfate (CARAFATE) 1 g tablet Take 1 tablet (1 g total) by mouth 4 (four) times daily. 10/10/19   Phineas Semen, MD  traMADol (ULTRAM) 50 MG tablet Take 1 tablet (50 mg total) by mouth every 6 (six) hours as needed. 11/09/19   Tommi Rumps, PA-C    Allergies Patient has no known allergies.  No family history on file.  Social History Social History   Tobacco Use  . Smoking status: Never Smoker  . Smokeless tobacco: Never Used  Substance Use Topics  . Alcohol use: Yes    Comment: occasionaly  . Drug use: No    Review of Systems Constitutional: No fever/chills Cardiovascular: Denies chest pain. Respiratory: Denies shortness of breath. Gastrointestinal: No abdominal pain.  No nausea, no vomiting.  Genitourinary: Negative for dysuria. Musculoskeletal: Positive for right-sided low back pain.  Positive for right radiculopathy lower extremity. Skin: Negative for rash. Neurological: Negative for headaches, focal weakness or numbness. ____________________________________________   PHYSICAL EXAM:  VITAL SIGNS: ED Triage Vitals  Enc Vitals Group     BP 11/09/19 0827 (!) 153/90     Pulse Rate 11/09/19 0827 85     Resp 11/09/19 0827 16     Temp 11/09/19 0827 98.7 F (37.1 C)     Temp Source  11/09/19 0827 Oral     SpO2 11/09/19 0827 97 %     Weight 11/09/19 0829 190 lb (86.2 kg)     Height 11/09/19 0829 5\' 5"  (1.651 m)     Head Circumference --      Peak Flow --      Pain Score 11/09/19 0829 10     Pain Loc --      Pain Edu? --      Excl. in [place]? --     Constitutional: Alert and oriented. Well appearing and in no acute distress. Eyes: Conjunctivae are normal.  Head: Atraumatic. Neck: No stridor.   Cardiovascular: Normal rate, regular rhythm. Grossly  normal heart sounds.  Good peripheral circulation. Respiratory: Normal respiratory effort.  No retractions. Lungs CTAB. Gastrointestinal: Soft and nontender. No distention. Musculoskeletal: On examination of the back there is no gross deformity there is however tenderness on palpation of the right SI joint area and surrounding tissue.  Range of motion is slow and guarded.  Good muscle strength lower extremities bilaterally.  Straight leg raises on the right is approximately 70 degrees. Neurologic:  Normal speech and language. No gross focal neurologic deficits are appreciated.  Reflexes 2+ bilaterally no gait instability. Skin:  Skin is warm, dry and intact. No rash noted. Psychiatric: Mood and affect are normal. Speech and behavior are normal.  ____________________________________________   LABS (all labs ordered are listed, but only abnormal results are displayed)  Labs Reviewed - No data to display  PROCEDURES  Procedure(s) performed (including Critical Care):  Procedures   ____________________________________________   INITIAL IMPRESSION / ASSESSMENT AND PLAN / ED COURSE  As part of my medical decision making, I reviewed the following data within the electronic MEDICAL RECORD NUMBER Notes from prior ED visits and [place] Controlled Substance Database  38 year old female presents to the ED with complaint of low back pain with right leg radiculopathy.  Patient denies any recent injury.  She has been seen in the past for sciatica.  She states that this is worse than the last time.  She is also been taking over-the-counter medication without any relief.  She denies any new symptoms such as saddle anesthesias, incontinence of bowel or bladder or urinary symptoms.  Patient has continued to ambulate without any assistance.  Physical exam is consistent with sciatica.  Patient was given a prescription for a tapering dose of prednisone along with a small amount of tramadol to take over the next 2 days.   Patient will continue using warm compresses or ice to her back.  She was given a list of clinics that are on a sliding scale for follow-up. ____________________________________________   FINAL CLINICAL IMPRESSION(S) / ED DIAGNOSES  Final diagnoses:  Acute right-sided low back pain with right-sided sciatica     ED Discharge Orders         Ordered    predniSONE (DELTASONE) 10 MG tablet     11/09/19 0901    traMADol (ULTRAM) 50 MG tablet  Every 6 hours PRN     11/09/19 0901           Note:  This document was prepared using Dragon voice recognition software and may include unintentional dictation errors.    Johnn Hai, PA-C 11/09/19 6195    Merlyn Lot, MD 11/09/19 1001

## 2019-11-09 NOTE — Discharge Instructions (Addendum)
Call the clinics listed on your discharge papers to see if they are taking new patients.  If so make an appointment for follow-up.  Begin taking the prednisone as directed starting with 6 tablets today and tapering down.  Tramadol is for pain if needed every 6 hours.  Do not drive or operate machinery while taking this medication as the tramadol could cause drowsiness.

## 2019-12-17 ENCOUNTER — Emergency Department: Payer: Self-pay

## 2019-12-17 ENCOUNTER — Encounter: Payer: Self-pay | Admitting: Medical Oncology

## 2019-12-17 ENCOUNTER — Other Ambulatory Visit: Payer: Self-pay

## 2019-12-17 ENCOUNTER — Emergency Department
Admission: EM | Admit: 2019-12-17 | Discharge: 2019-12-17 | Disposition: A | Discharge status: Home / Self Care | Payer: Self-pay | Attending: Emergency Medicine | Admitting: Emergency Medicine

## 2019-12-17 DIAGNOSIS — M5441 Lumbago with sciatica, right side: Secondary | ICD-10-CM | POA: Insufficient documentation

## 2019-12-17 DIAGNOSIS — Z79899 Other long term (current) drug therapy: Secondary | ICD-10-CM | POA: Insufficient documentation

## 2019-12-17 LAB — POCT PREGNANCY, URINE: Preg Test, Ur: NEGATIVE

## 2019-12-17 MED ORDER — KETOROLAC TROMETHAMINE 30 MG/ML IJ SOLN
30.0000 mg | Freq: Once | INTRAMUSCULAR | Status: AC
  Administered 2019-12-17: 30 mg via INTRAMUSCULAR
  Filled 2019-12-17: qty 1

## 2019-12-17 MED ORDER — HYDROCODONE-ACETAMINOPHEN 5-325 MG PO TABS: 1.0000 | ORAL_TABLET | Freq: Four times a day (QID) | ORAL | 0 refills | Status: AC | PRN

## 2019-12-17 MED ORDER — PREDNISONE 10 MG PO TABS: ORAL_TABLET | ORAL | 0 refills | Status: AC

## 2019-12-17 NOTE — ED Notes (Signed)
See triage note  Present with pain to lower back which is moving into right leg  States pain started 2 days ago  Denies any recent injury  Hx of sciatica  Ambulates with slight limp

## 2019-12-17 NOTE — ED Triage Notes (Signed)
Pt reports hx of sciatica, pain that radiates into rt leg has been worsening the past couple days. Pt is ambulatory.

## 2019-12-17 NOTE — Discharge Instructions (Signed)
Follow-up with 1 the clinics listed on your discharge papers to obtain a primary care provider.  These are listed on your discharge papers.  Also Dr. Joice Lofts is the orthopedist on call and if you continue to have problems with your back you should call and make an appointment with him.  Continue the medications that were prescribed for you today.  You may use ice or heat to your back as needed for discomfort.

## 2019-12-17 NOTE — ED Provider Notes (Signed)
[facility] Emergency Department Provider Note  ____________________________________________   First MD Initiated Contact with Patient 12/17/19 [phone]     (approximate)  I have reviewed the triage vital signs and the nursing notes.   HISTORY  Chief Complaint Leg Pain   HPI Danielle Huffman is a 39 y.o. female presents to the ED with complaint of low back pain with radiculopathy into her right leg.  Patient states that she has been seen experiencing problems in the past and was seen in the ED on 11/09/2019 for the same.  She states that she was started on prednisone which seems to be helping her while she was taking it.  She denies any recent injury.  She denies any urinary symptoms, saddle anesthesias or incontinence of bowel or bladder.  She denies any injury to her lower back.  She states that her back has never been x-rayed in the past.  Currently she is taken over-the-counter medication without any improvement.  She rates her pain as a 10/10.      History reviewed. No pertinent past medical history.  There are no problems to display for this patient.   History reviewed. No pertinent surgical history.  Prior to Admission medications   Medication Sig Start Date End Date Taking? Authorizing Provider  aspirin-acetaminophen-caffeine (EXCEDRIN MIGRAINE) [phone] MG tablet Take 1 tablet by mouth every 6 (six) hours as needed for headache.    [provider]  famotidine (PEPCID) 20 MG tablet Take 1 tablet (20 mg total) by mouth daily. 10/10/19 10/09/20  Phineas Semen, MD  promethazine (PHENERGAN) 25 MG tablet Take 1 tablet (25 mg total) by mouth every 8 (eight) hours as needed for nausea or vomiting. 11/02/15   Sharyn Creamer, MD  sucralfate (CARAFATE) 1 g tablet Take 1 tablet (1 g total) by mouth 4 (four) times daily. 10/10/19   Phineas Semen, MD    Allergies Patient has no known allergies.  No family history on file.  Social  History Social History   Tobacco Use  . Smoking status: Never Smoker  . Smokeless tobacco: Never Used  Substance Use Topics  . Alcohol use: Yes    Comment: occasionaly  . Drug use: No    Review of Systems Constitutional: No fever/chills Cardiovascular: Denies chest pain. Respiratory: Denies shortness of breath. Gastrointestinal: No abdominal pain.  No nausea, no vomiting.  No diarrhea.  Genitourinary: Negative for dysuria. Musculoskeletal: Positive for low back pain with right leg radiculopathy. Skin: Negative for rash. Neurological: Negative for headaches, focal weakness or numbness. ___________________________________________   PHYSICAL EXAM:  VITAL SIGNS: ED Triage Vitals [12/17/19 0715]  Enc Vitals Group     BP (!) 141/95     Pulse Rate 90     Resp 16     Temp 98.2 F (36.8 C)     Temp Source Oral     SpO2 97 %     Weight 190 lb (86.2 kg)     Height 5\' 5"  (1.651 m)     Head Circumference      Peak Flow      Pain Score 10     Pain Loc      Pain Edu?      Excl. in GC?     Constitutional: Alert and oriented. Well appearing and in no acute distress. Eyes: Conjunctivae are normal.  Head: Atraumatic. Neck: No stridor.   Cardiovascular: Normal rate, regular rhythm. Grossly normal heart sounds.  Good peripheral circulation. Respiratory: Normal respiratory  effort.  No retractions. Lungs CTAB. Gastrointestinal: Soft and nontender. No distention.  Musculoskeletal: On examination of the lower back there is no gross deformity noted.  There is tenderness on palpation of the sacral and right SI joint area.  Range of motion is slightly restricted secondary to discomfort.  Good muscle strength bilaterally.  Straight leg raises are positive at 30 degrees in the right leg. Neurologic:  Normal speech and language. No gross focal neurologic deficits are appreciated.  Reflexes are 2+ bilaterally.  No gait instability. Skin:  Skin is warm, dry and intact. No rash  noted. Psychiatric: Mood and affect are normal. Speech and behavior are normal.  ____________________________________________   LABS (all labs ordered are listed, but only abnormal results are displayed)  Labs Reviewed  POC URINE PREG, ED  POCT PREGNANCY, URINE   RADIOLOGY  Official radiology report(s): DG Lumbar Spine 2-3 Views  Result Date: 12/17/2019 CLINICAL DATA:  Low back pain radiating to the right lower extremity for 2 days. No reported injury. EXAM: LUMBAR SPINE - 2-3 VIEW COMPARISON:  11/02/2015 CT abdomen/pelvis. FINDINGS: This report assumes 5 non rib-bearing lumbar vertebrae. Lumbar vertebral body heights are preserved, with no fracture. Mild loss of disc height at L4-5. Otherwise preserved lumbar discs. No spondylolisthesis. No appreciable facet arthropathy. No aggressive appearing focal osseous lesions. IMPRESSION: 1. No acute osseous abnormality. 2. Mild loss of disc height at L4-5.  No spondylolisthesis. Electronically Signed   By: Delbert Phenix M.D.   On: 12/17/2019 09:17    ____________________________________________   PROCEDURES  Procedure(s) performed (including Critical Care):  Procedures  ____________________________________________   INITIAL IMPRESSION / ASSESSMENT AND PLAN / ED COURSE  As part of my medical decision making, I reviewed the following data within the electronic MEDICAL RECORD NUMBER Notes from prior ED visits and [place] Controlled Substance Database Shanise Braileigh Landenberger was evaluated in Emergency Department on 12/17/2019 for the symptoms described in the history of present illness. She was evaluated in the context of the global COVID-19 pandemic, which necessitated consideration that the patient might be at risk for infection with the SARS-CoV-2 virus that causes COVID-19. Institutional protocols and algorithms that pertain to the evaluation of patients at risk for COVID-19 are in a state of rapid change based on information released by regulatory bodies  including the CDC and federal and state organizations. These policies and algorithms were followed during the patient's care in the ED.  39 year old female presents to the ED with complaint of low back pain with radiation into her right leg.  She was seen in the ED on 11/09/2019 for the same.  She states that that time she was prescribed prednisone and while she was taking it seemed to be improving.  She denies any recent injury to her back.  She does not have a PCP.  Patient was given Toradol 30 mg IM while in the ED.  She is to continue with a prescription for prednisone 6-day course and Norco if needed for pain.  Patient is to follow-up with orthopedics if any continued problems.  She was also given a list of clinics in the area that charged per sliding scale.  ____________________________________________   FINAL CLINICAL IMPRESSION(S) / ED DIAGNOSES  Final diagnoses:  None     ED Discharge Orders    None       Note:  This document was prepared using Dragon voice recognition software and may include unintentional dictation errors.    Tommi Rumps, PA-C 12/17/19 1008  Arta Silence, MD 12/17/19 1051

## 2020-06-25 ENCOUNTER — Telehealth: Payer: Self-pay | Admitting: General Practice

## 2020-06-25 NOTE — Telephone Encounter (Signed)
Individual has been contacted 3+ times regarding ED referral. No further attempts to contact individual will be made. 

## 2021-07-02 IMAGING — CR DG CHEST 2V
2 series · 2 of 2 positions shown · non-contrast
Comparison: 11/02/2015

CLINICAL DATA: Chest pain

EXAM:
CHEST - 2 VIEW

[chest pa]
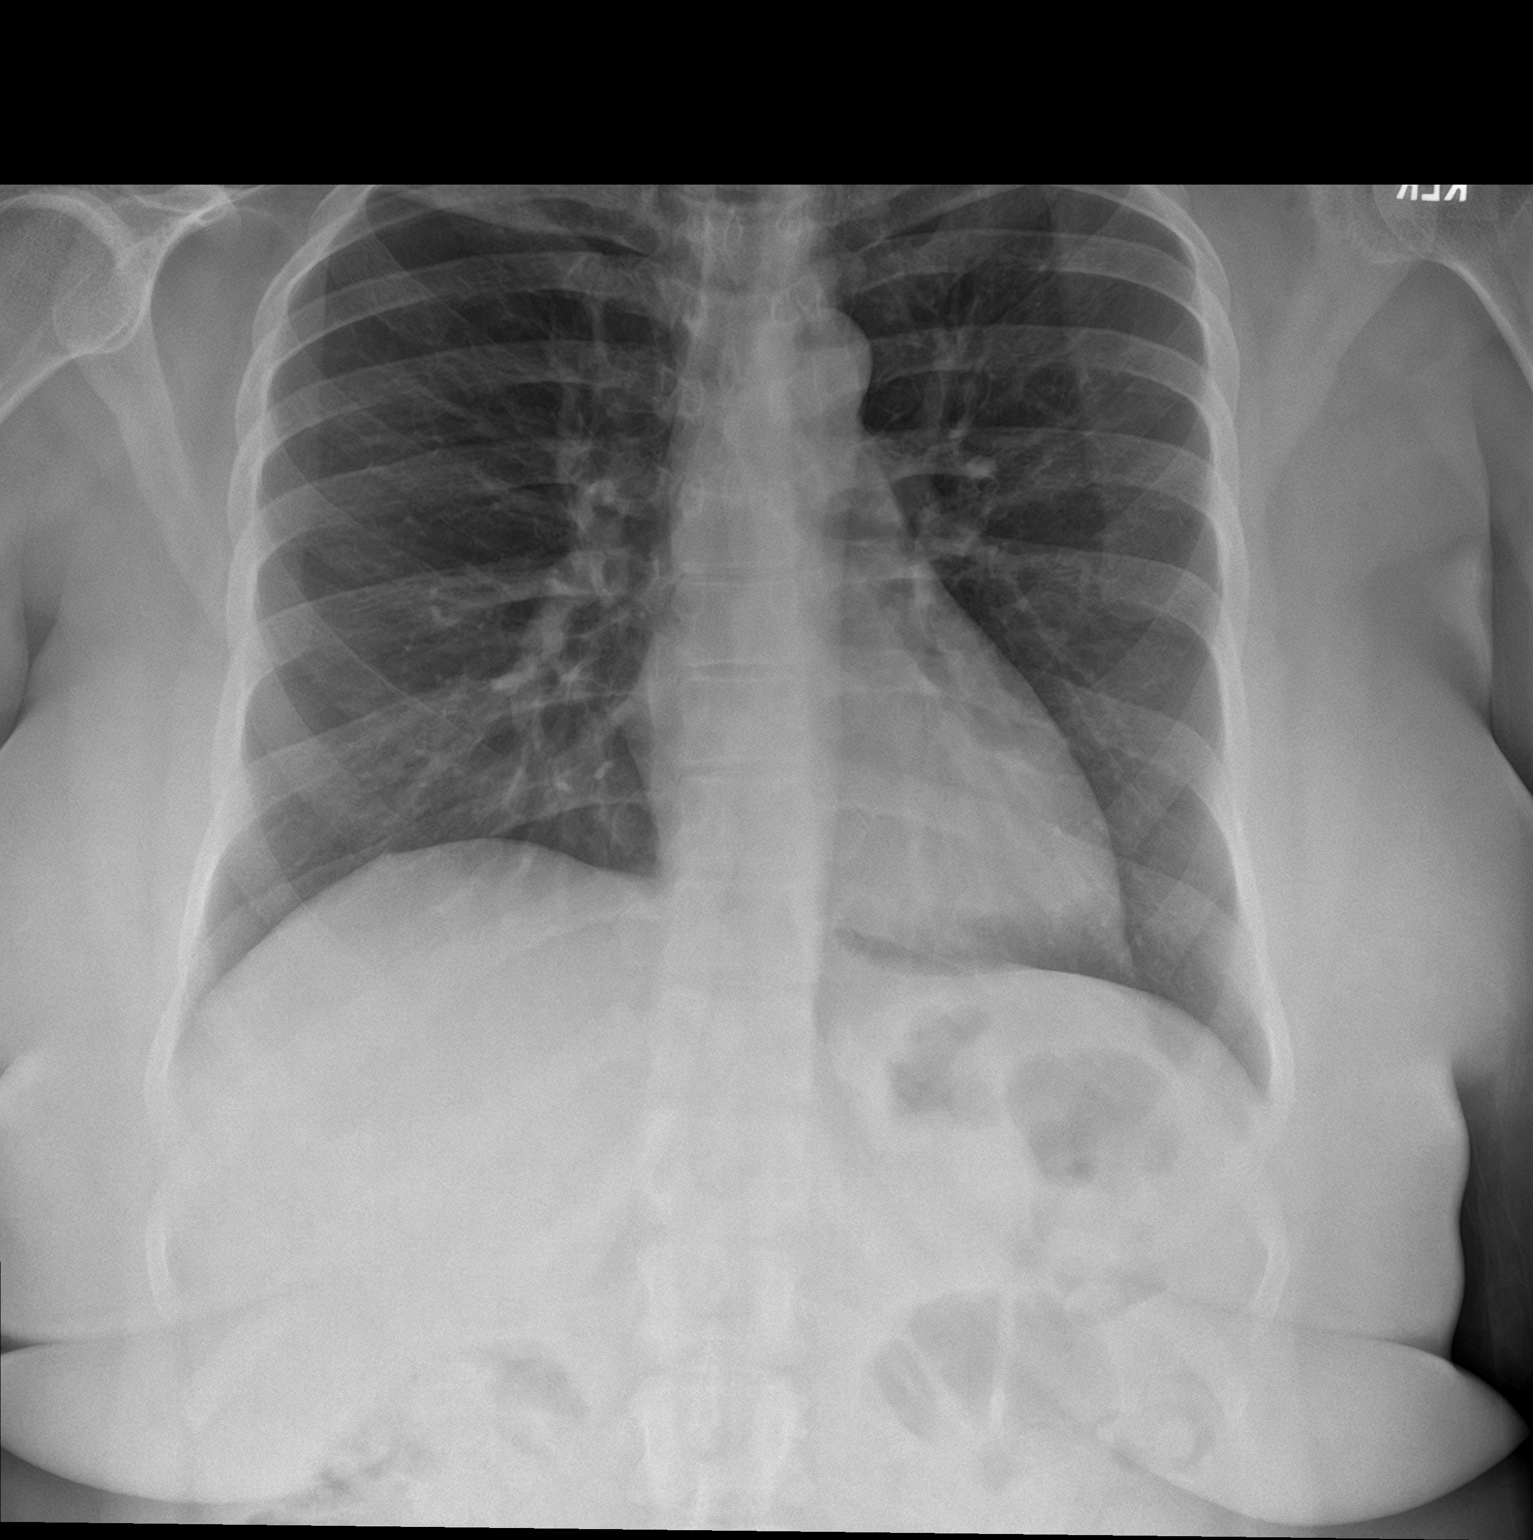

[chest lat]
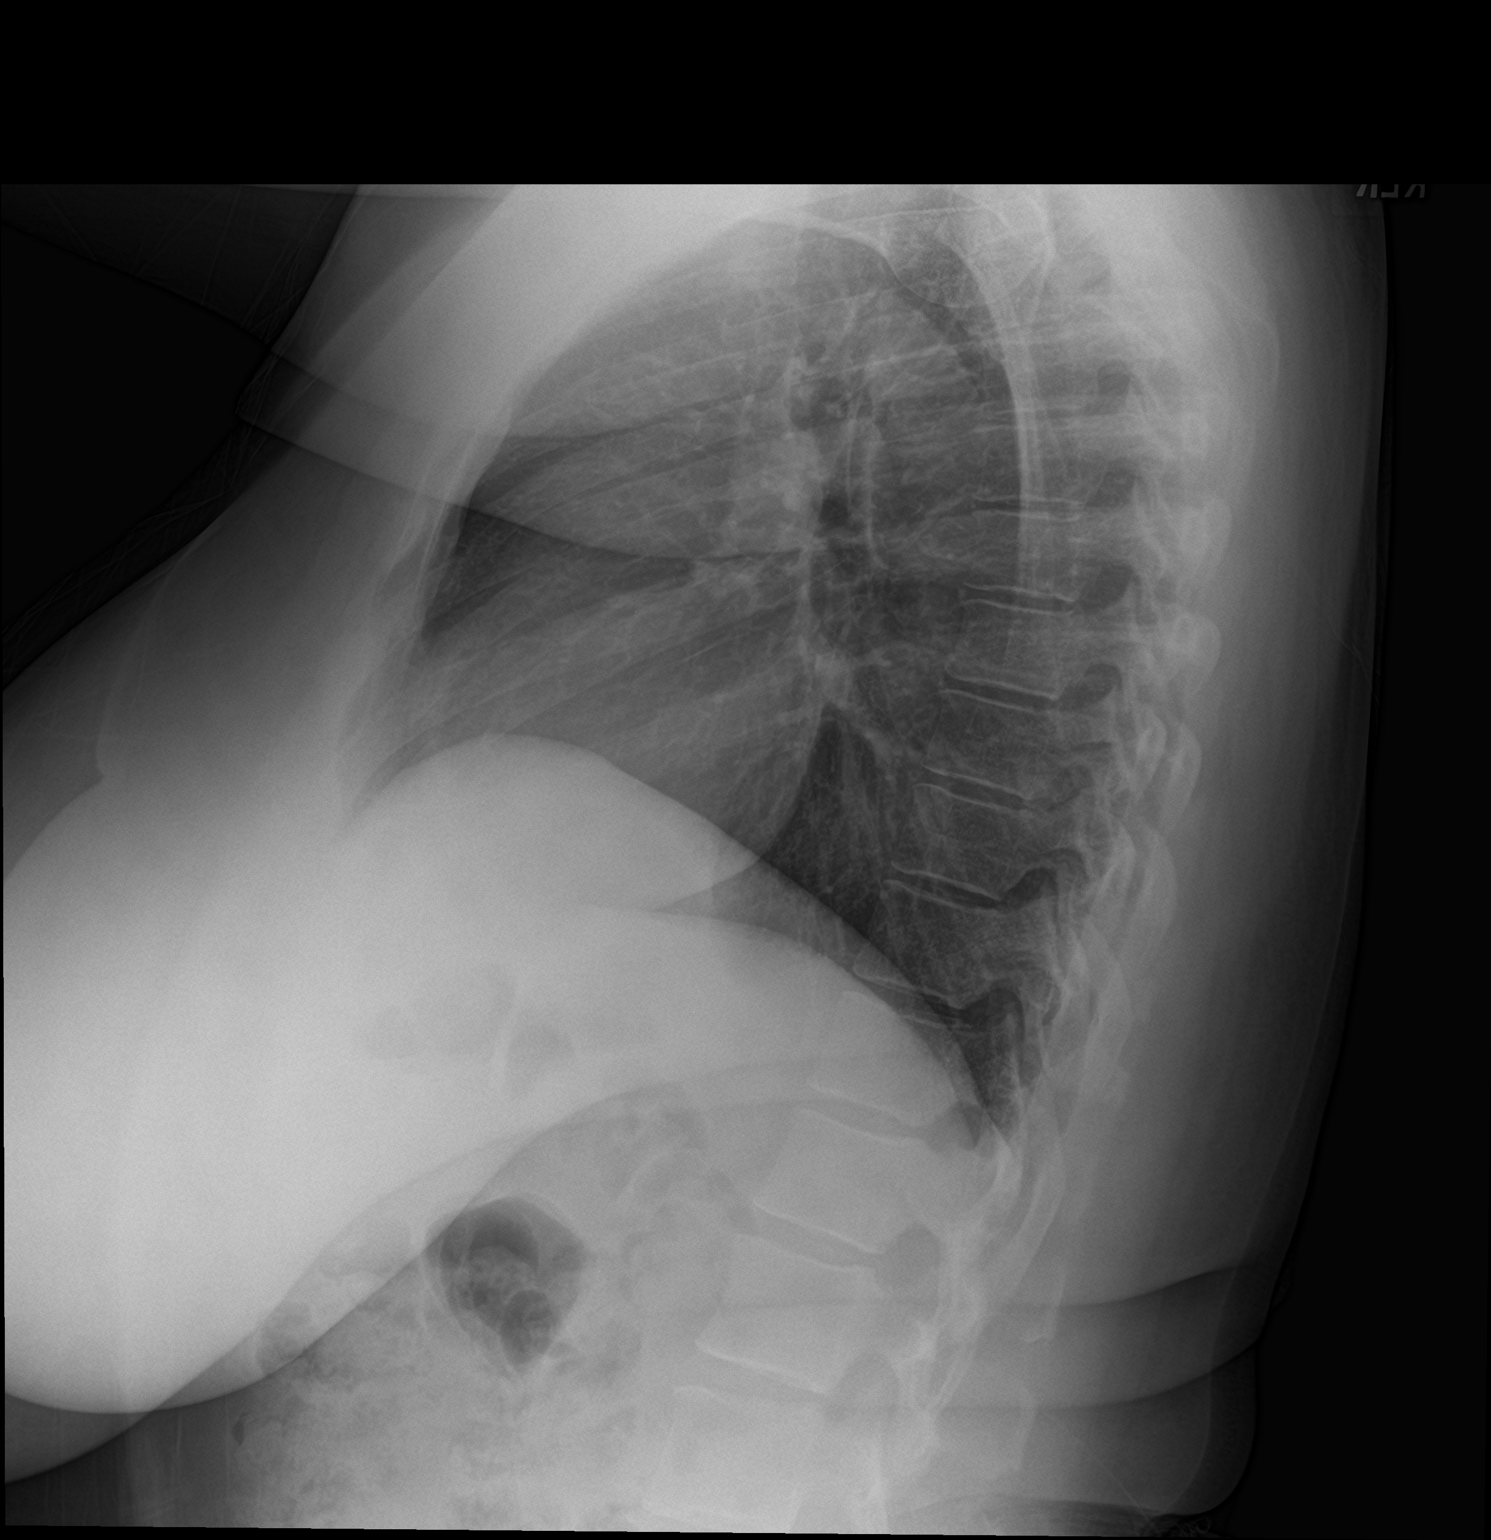

[2 of 2 positions shown; findings below may reference images not displayed]

FINDINGS: The heart size and mediastinal contours are within normal limits.
Both lungs are clear. The visualized skeletal structures are
unremarkable.
IMPRESSION: No active cardiopulmonary disease.
